# Patient Record
Sex: Female | Born: 1950 | Race: White | Hispanic: No | Marital: Married | Smoking: Current some day smoker
Health system: Southern US, Community
[De-identification: ages and names within clinical notes are randomized; demographics above are authoritative.]

---

## 2007-11-22 ENCOUNTER — Ambulatory Visit: Payer: Self-pay | Admitting: Occupational Medicine

## 2007-11-22 DIAGNOSIS — J209 Acute bronchitis, unspecified: Secondary | ICD-10-CM | POA: Insufficient documentation

## 2007-11-26 ENCOUNTER — Telehealth (INDEPENDENT_AMBULATORY_CARE_PROVIDER_SITE_OTHER): Payer: Self-pay | Admitting: Occupational Medicine

## 2007-12-03 ENCOUNTER — Encounter: Admission: RE | Admit: 2007-12-03 | Discharge: 2007-12-03 | Payer: Self-pay | Admitting: Occupational Medicine

## 2009-08-01 IMAGING — CR DG CHEST 2V
2 series · 2 of 2 positions shown · non-contrast
Comparison: 11/22/2007.

CLINICAL DATA: Follow-up nodular densities seen on chest
radiographs dated 11/22/2007.

CHEST - 2 VIEW

[view not recorded (1 of 2)]
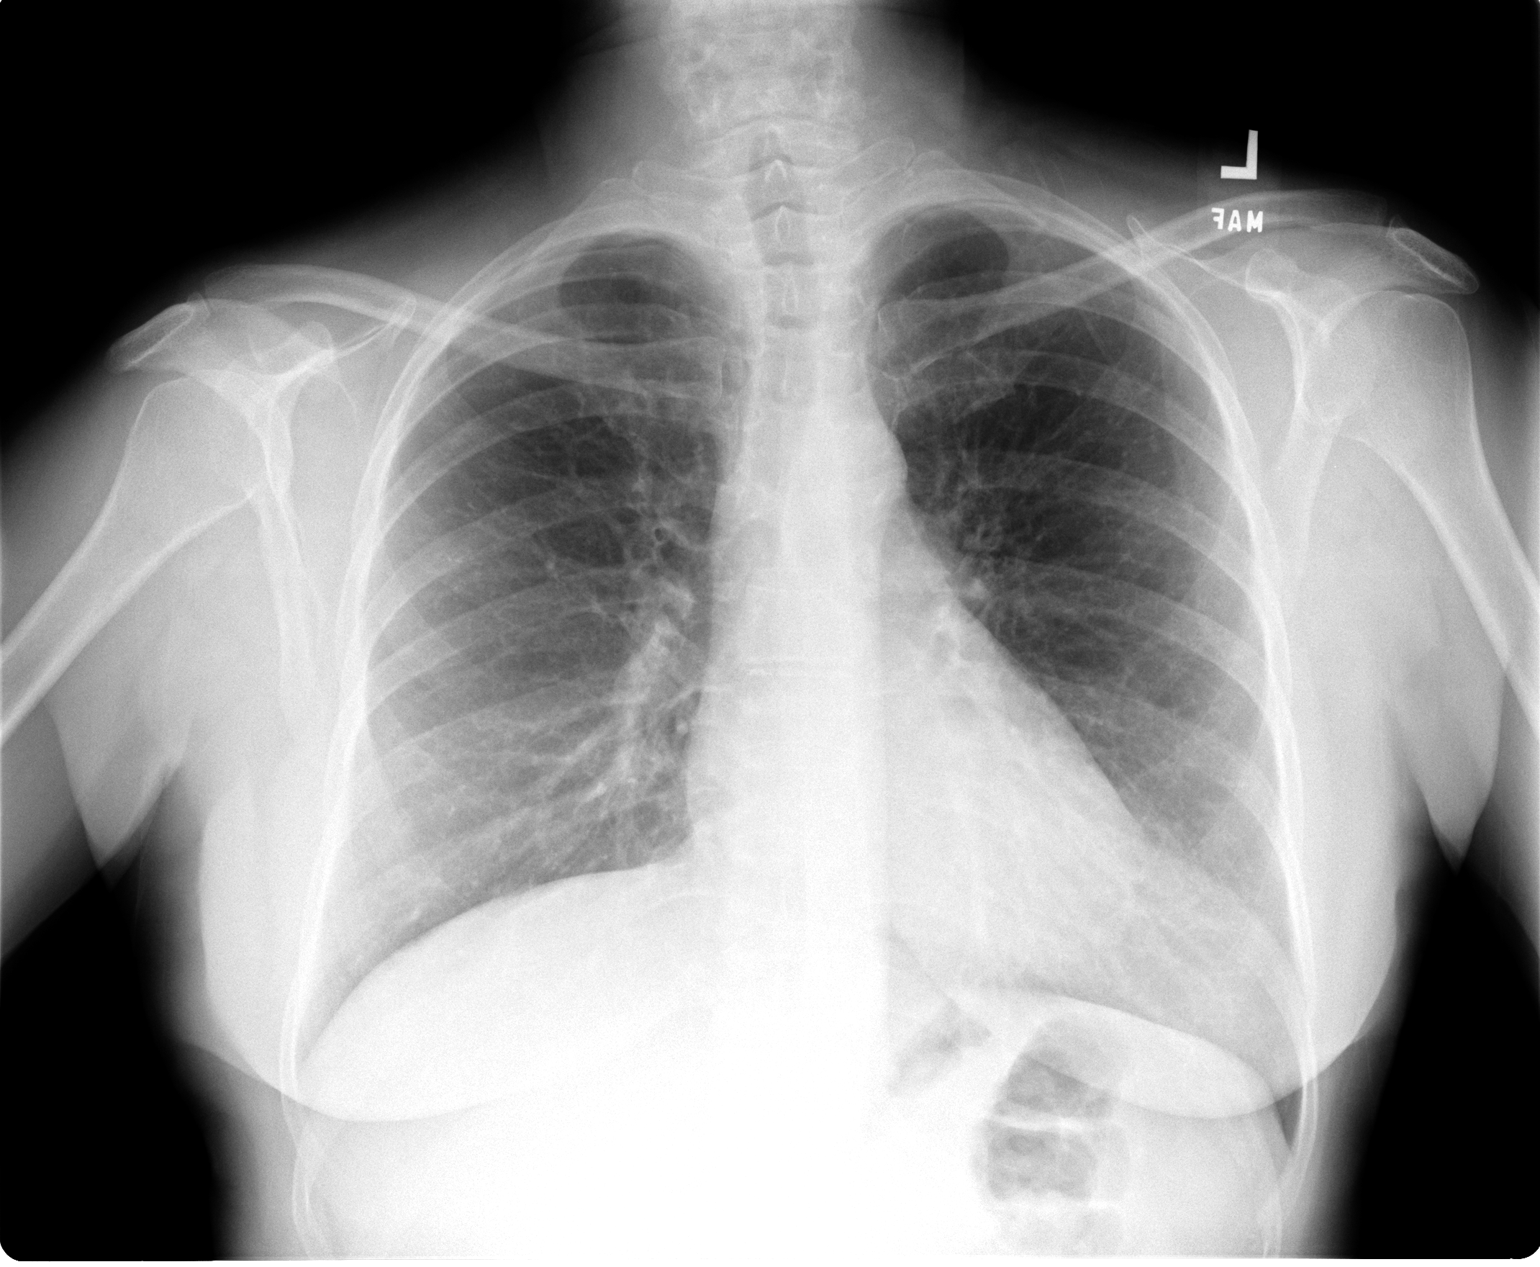

[view not recorded (2 of 2)]
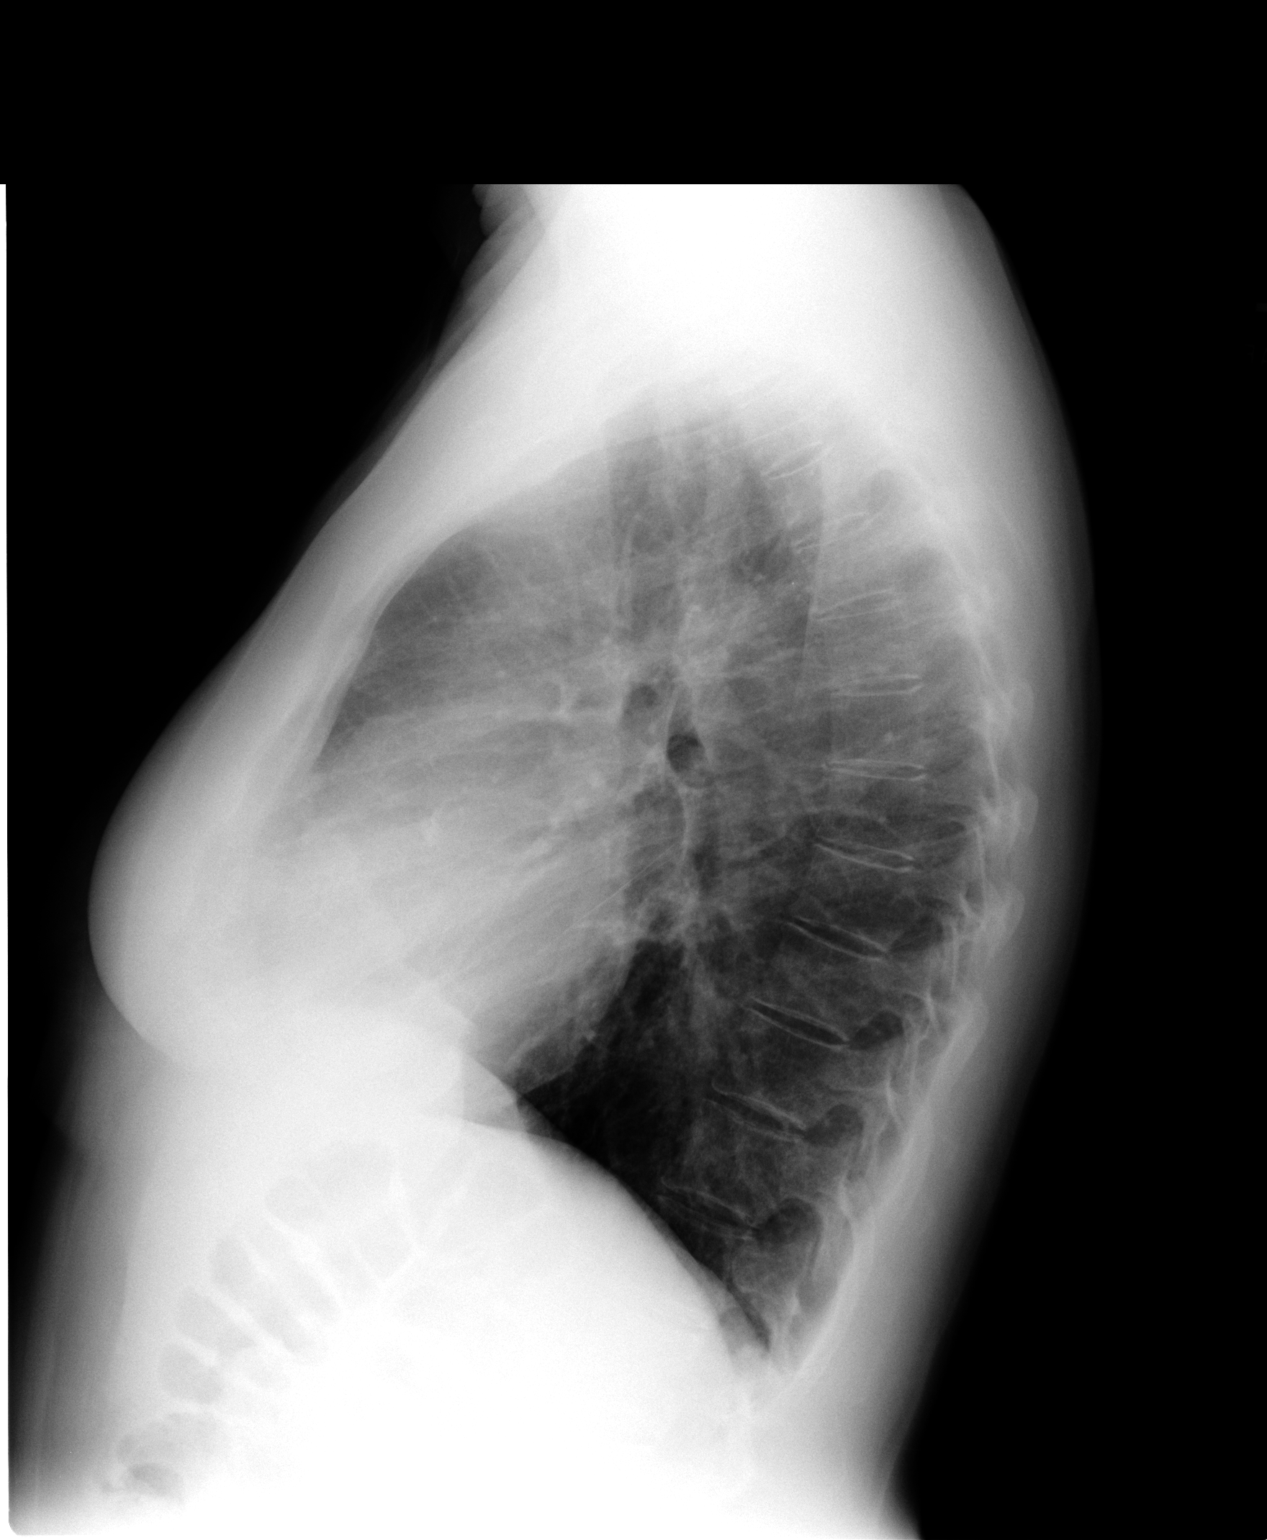

[2 of 2 positions shown; findings below may reference images not displayed]

FINDINGS: Normal sized heart.  Clear lungs.  Stable changes of COPD
and chronic bronchitis.  The previously seen vague nodular opacity
overlying the right upper lung zone is no longer demonstrated.  The
previously demonstrated small nodular density overlying the heart
on the lateral view is unchanged.  Unremarkable bones.
IMPRESSION: 1.  The previously seen vague nodular density in the right upper
lung zone is not currently visualized.
2.  Persistent small nodular density overlying the heart on the
lateral view.  Follow-up chest radiographs are recommended in 3-6
months to assess stability.  Alternatively, this could be further
evaluated with chest CT with contrast at this time.
3.  Stable changes of COPD and chronic bronchitis.

## 2011-03-31 ENCOUNTER — Ambulatory Visit: Payer: Self-pay | Admitting: Physician Assistant

## 2020-02-01 ENCOUNTER — Ambulatory Visit: Payer: Self-pay

## 2020-11-10 ENCOUNTER — Other Ambulatory Visit: Payer: Self-pay

## 2020-11-10 ENCOUNTER — Emergency Department
Admission: RE | Admit: 2020-11-10 | Discharge: 2020-11-10 | Disposition: A | Discharge status: Home / Self Care | Payer: Medicare Other | Source: Ambulatory Visit

## 2020-11-10 VITALS — BP 135/79 | HR 92 | Temp 98.9°F | Ht 62.0 in | Wt 130.0 lb

## 2020-11-10 DIAGNOSIS — J0100 Acute maxillary sinusitis, unspecified: Secondary | ICD-10-CM

## 2020-11-10 DIAGNOSIS — J309 Allergic rhinitis, unspecified: Secondary | ICD-10-CM | POA: Diagnosis not present

## 2020-11-10 MED ORDER — BENZONATATE 200 MG PO CAPS: 200.0000 mg | ORAL_CAPSULE | Freq: Three times a day (TID) | ORAL | 0 refills | Status: AC | PRN

## 2020-11-10 MED ORDER — CEFDINIR 300 MG PO CAPS: 300.0000 mg | ORAL_CAPSULE | Freq: Two times a day (BID) | ORAL | 0 refills | Status: AC

## 2020-11-10 MED ORDER — FEXOFENADINE HCL 180 MG PO TABS: 180.0000 mg | ORAL_TABLET | Freq: Every day | ORAL | 0 refills | Status: AC

## 2020-11-10 NOTE — Discharge Instructions (Addendum)
Advised patient to take medication as directed with food to completion.  Advised patient to take Allegra daily with first dose of antibiotic for the next 7 days for concurrent postnasal drip/drainage, then as needed.  Advised patient may use Tessalon Perles daily or as needed for cough.  Encouraged patient increase daily water intake while taking these medications.

## 2020-11-10 NOTE — ED Provider Notes (Signed)
[facility]    CSN: [id number] Arrival date & time: 11/10/20  1338      History   Chief Complaint Chief Complaint  Patient presents with   Appointment    HPI Terri Jordan is a 70 y.o. female.   HPI 70 year old female presents with cough, head/nasal congestion, runny nose and head pressure for 1 to 2 weeks.  Patient reports was evaluated by her PCP on Monday, 11/05/2020 and was prescribed Medrol Dosepak and Augmentin.  Patient reports taking Medrol Dosepak as prescribed however Augmentin made her sick.  Patient is not vaccinated for COVID-19.  History reviewed. No pertinent past medical history.  Patient Active Problem List   Diagnosis Date Noted   BRONCHITIS, ACUTE 11/22/2007    History reviewed. No pertinent surgical history.  OB History   No obstetric history on file.      Home Medications    Prior to Admission medications   Medication Sig Start Date End Date Taking? Authorizing Provider  benzonatate (TESSALON) 200 MG capsule Take 1 capsule (200 mg total) by mouth 3 (three) times daily as needed for up to 7 days for cough. 11/10/20 11/17/20 Yes Trevor Iha, FNP  cefdinir (OMNICEF) 300 MG capsule Take 1 capsule (300 mg total) by mouth 2 (two) times daily for 7 days. 11/10/20 11/17/20 Yes Trevor Iha, FNP  fexofenadine [facility]) 180 MG tablet Take 1 tablet (180 mg total) by mouth daily for 15 days. 11/10/20 11/25/20 Yes Trevor Iha, FNP  HYDROcodone bit-homatropine [facility]) 5-1.5 MG/5ML syrup Take by mouth. 11/05/20 11/15/20 Yes [provider]    Family History History reviewed. No pertinent family history.  Social History Social History   Tobacco Use   Smoking status: Some Days    Types: Cigarettes   Smokeless tobacco: Never  Substance Use Topics   Alcohol use: Never   Drug use: Never     Allergies   Azithromycin   Review of Systems Review of Systems  HENT:  Positive for congestion, postnasal drip and  sore throat.   All other systems reviewed and are negative.   Physical Exam Triage Vital Signs ED Triage Vitals  Enc Vitals Group     BP      Pulse      Resp      Temp      Temp src      SpO2      Weight      Height      Head Circumference      Peak Flow      Pain Score      Pain Loc      Pain Edu?      Excl. in GC?    No data found.  Updated Vital Signs BP 135/79 (BP Location: Right Arm)   Pulse 92   Temp 98.9 F (37.2 C) (Oral)   Ht 5\' 2"  (1.575 m)   Wt 130 lb (59 kg)   SpO2 94%   BMI 23.78 kg/m       Physical Exam Vitals and nursing note reviewed.  Constitutional:      Appearance: Normal appearance. She is normal weight.  HENT:     Head: Normocephalic and atraumatic.     Right Ear: Tympanic membrane, ear canal and external ear normal.     Left Ear: Tympanic membrane, ear canal and external ear normal.     Mouth/Throat:     Mouth: Mucous membranes are moist.     Pharynx: Oropharynx  is clear.  Eyes:     Extraocular Movements: Extraocular movements intact.     Conjunctiva/sclera: Conjunctivae normal.     Pupils: Pupils are equal, round, and reactive to light.     Comments: Moderate clear drainage of posterior oropharynx noted  Cardiovascular:     Rate and Rhythm: Normal rate and regular rhythm.     Heart sounds: No murmur heard. Pulmonary:     Effort: Pulmonary effort is normal.     Breath sounds: Normal breath sounds. No wheezing, rhonchi or rales.  Musculoskeletal:        General: Normal range of motion.     Cervical back: Normal range of motion and neck supple. No tenderness.  Lymphadenopathy:     Cervical: No cervical adenopathy.  Skin:    General: Skin is warm and dry.  Neurological:     General: No focal deficit present.     Mental Status: She is alert and oriented to person, place, and time. Mental status is at baseline.  Psychiatric:        Mood and Affect: Mood normal.        Behavior: Behavior normal.        Thought Content: Thought  content normal.     UC Treatments / Results  Labs (all labs ordered are listed, but only abnormal results are displayed) Labs Reviewed - No data to display  EKG   Radiology No results found.  Procedures Procedures (including critical care time)  Medications Ordered in UC Medications - No data to display  Initial Impression / Assessment and Plan / UC Course  I have reviewed the triage vital signs and the nursing notes.  Pertinent labs & imaging results that were available during my care of the patient were reviewed by me and considered in my medical decision making (see chart for details).     MDM: 1.  Subacute maxillary sinusitis-Rx'd Cefdinir; 2.  Allergic rhinitis-Rx'd Allegra; 3.  Cough-Rx Tessalon Perles. Advised patient to take medication as directed with food to completion.  Advised patient to take Allegra daily with first dose of antibiotic for the next 7 days for concurrent postnasal drip/drainage, then as needed.  Advised patient may use Tessalon Perles daily or as needed for cough.  Encouraged patient increase daily water intake while taking these medications.  Patient discharged home, hemodynamically stable. Final Clinical Impressions(s) / UC Diagnoses   Final diagnoses:  Subacute maxillary sinusitis  Allergic rhinitis, unspecified seasonality, unspecified trigger     Discharge Instructions      Advised patient to take medication as directed with food to completion.  Advised patient to take Allegra daily with first dose of antibiotic for the next 7 days for concurrent postnasal drip/drainage, then as needed.  Advised patient may use Tessalon Perles daily or as needed for cough.  Encouraged patient increase daily water intake while taking these medications.     ED Prescriptions     Medication Sig Dispense Auth. Provider   cefdinir (OMNICEF) 300 MG capsule Take 1 capsule (300 mg total) by mouth 2 (two) times daily for 7 days. 14 capsule Trevor Iha, FNP    fexofenadine [facility]ERGY) 180 MG tablet Take 1 tablet (180 mg total) by mouth daily for 15 days. 15 tablet Trevor Iha, FNP   benzonatate (TESSALON) 200 MG capsule Take 1 capsule (200 mg total) by mouth 3 (three) times daily as needed for up to 7 days for cough. 30 capsule Trevor Iha, FNP  PDMP not reviewed this encounter.   Trevor Iha, FNP 11/10/20 1426

## 2020-11-10 NOTE — ED Triage Notes (Signed)
Patient was seen by her PCP and dx with an URI on 11/05/20.  Patient given methylprednisolone 4mg  for several days.  Patient is having head congestion, runny nose, cough, head pressure.  Patient did take Amoxil 875mg  last night and it made her sick.  Patient is not vaccinated for COVID.
